# Patient Record
Sex: Female | Born: 1935
Health system: Southern US, Community
[De-identification: ages and names within clinical notes are randomized; demographics above are authoritative.]

## PROBLEM LIST (undated history)

## (undated) DIAGNOSIS — R011 Cardiac murmur, unspecified: Secondary | ICD-10-CM

## (undated) DIAGNOSIS — I10 Essential (primary) hypertension: Secondary | ICD-10-CM

## (undated) DIAGNOSIS — I25119 Atherosclerotic heart disease of native coronary artery with unspecified angina pectoris: Secondary | ICD-10-CM

## (undated) DIAGNOSIS — S8990XD Unspecified injury of unspecified lower leg, subsequent encounter: Secondary | ICD-10-CM

## (undated) HISTORY — DX: Atherosclerotic heart disease of native coronary artery with unspecified angina pectoris: I25.119

## (undated) HISTORY — PX: APPENDECTOMY: SHX54

## (undated) HISTORY — DX: Essential (primary) hypertension: I10

## (undated) HISTORY — PX: KNEE SURGERY: SHX244

## (undated) HISTORY — DX: Cardiac murmur, unspecified: R01.1

## (undated) HISTORY — DX: Unspecified injury of unspecified lower leg, subsequent encounter: S89.90XD

---

## 2013-02-20 DIAGNOSIS — Z23 Encounter for immunization: Secondary | ICD-10-CM | POA: Diagnosis not present

## 2013-03-16 DIAGNOSIS — K5900 Constipation, unspecified: Secondary | ICD-10-CM | POA: Diagnosis not present

## 2013-03-19 DIAGNOSIS — L821 Other seborrheic keratosis: Secondary | ICD-10-CM | POA: Diagnosis not present

## 2013-03-19 DIAGNOSIS — C44721 Squamous cell carcinoma of skin of unspecified lower limb, including hip: Secondary | ICD-10-CM | POA: Diagnosis not present

## 2013-03-19 DIAGNOSIS — Z85828 Personal history of other malignant neoplasm of skin: Secondary | ICD-10-CM | POA: Diagnosis not present

## 2013-03-21 DIAGNOSIS — R9431 Abnormal electrocardiogram [ECG] [EKG]: Secondary | ICD-10-CM | POA: Diagnosis not present

## 2013-03-21 DIAGNOSIS — I447 Left bundle-branch block, unspecified: Secondary | ICD-10-CM | POA: Diagnosis not present

## 2013-03-21 DIAGNOSIS — I10 Essential (primary) hypertension: Secondary | ICD-10-CM | POA: Diagnosis not present

## 2013-03-21 DIAGNOSIS — I509 Heart failure, unspecified: Secondary | ICD-10-CM | POA: Diagnosis not present

## 2013-03-21 DIAGNOSIS — I359 Nonrheumatic aortic valve disorder, unspecified: Secondary | ICD-10-CM | POA: Diagnosis not present

## 2013-03-21 DIAGNOSIS — E7800 Pure hypercholesterolemia, unspecified: Secondary | ICD-10-CM | POA: Diagnosis not present

## 2013-03-27 DIAGNOSIS — T8140XA Infection following a procedure, unspecified, initial encounter: Secondary | ICD-10-CM | POA: Diagnosis not present

## 2013-03-27 DIAGNOSIS — C4492 Squamous cell carcinoma of skin, unspecified: Secondary | ICD-10-CM | POA: Diagnosis not present

## 2013-04-23 DIAGNOSIS — S80929A Unspecified superficial injury of unspecified lower leg, initial encounter: Secondary | ICD-10-CM | POA: Diagnosis not present

## 2013-04-23 DIAGNOSIS — S70919A Unspecified superficial injury of unspecified hip, initial encounter: Secondary | ICD-10-CM | POA: Diagnosis not present

## 2013-04-23 DIAGNOSIS — S70929A Unspecified superficial injury of unspecified thigh, initial encounter: Secondary | ICD-10-CM | POA: Diagnosis not present

## 2013-04-23 DIAGNOSIS — Z23 Encounter for immunization: Secondary | ICD-10-CM | POA: Diagnosis not present

## 2013-05-02 DIAGNOSIS — IMO0002 Reserved for concepts with insufficient information to code with codable children: Secondary | ICD-10-CM | POA: Diagnosis not present

## 2013-05-02 DIAGNOSIS — C44721 Squamous cell carcinoma of skin of unspecified lower limb, including hip: Secondary | ICD-10-CM | POA: Diagnosis not present

## 2013-05-02 DIAGNOSIS — D485 Neoplasm of uncertain behavior of skin: Secondary | ICD-10-CM | POA: Diagnosis not present

## 2013-05-23 DIAGNOSIS — Z961 Presence of intraocular lens: Secondary | ICD-10-CM | POA: Diagnosis not present

## 2013-05-30 DIAGNOSIS — K5909 Other constipation: Secondary | ICD-10-CM | POA: Diagnosis not present

## 2013-06-01 DIAGNOSIS — C44721 Squamous cell carcinoma of skin of unspecified lower limb, including hip: Secondary | ICD-10-CM | POA: Diagnosis not present

## 2013-06-18 DIAGNOSIS — L905 Scar conditions and fibrosis of skin: Secondary | ICD-10-CM | POA: Diagnosis not present

## 2013-06-18 DIAGNOSIS — L570 Actinic keratosis: Secondary | ICD-10-CM | POA: Diagnosis not present

## 2013-06-18 DIAGNOSIS — Z85828 Personal history of other malignant neoplasm of skin: Secondary | ICD-10-CM | POA: Diagnosis not present

## 2013-06-18 DIAGNOSIS — D485 Neoplasm of uncertain behavior of skin: Secondary | ICD-10-CM | POA: Diagnosis not present

## 2013-06-21 DIAGNOSIS — M1710 Unilateral primary osteoarthritis, unspecified knee: Secondary | ICD-10-CM | POA: Diagnosis not present

## 2013-06-26 DIAGNOSIS — M1710 Unilateral primary osteoarthritis, unspecified knee: Secondary | ICD-10-CM | POA: Diagnosis not present

## 2013-07-11 DIAGNOSIS — Z1231 Encounter for screening mammogram for malignant neoplasm of breast: Secondary | ICD-10-CM | POA: Diagnosis not present

## 2013-07-11 DIAGNOSIS — Z9189 Other specified personal risk factors, not elsewhere classified: Secondary | ICD-10-CM | POA: Diagnosis not present

## 2013-08-04 DIAGNOSIS — IMO0001 Reserved for inherently not codable concepts without codable children: Secondary | ICD-10-CM | POA: Diagnosis not present

## 2013-08-04 DIAGNOSIS — T148 Other injury of unspecified body region: Secondary | ICD-10-CM | POA: Diagnosis not present

## 2013-08-04 DIAGNOSIS — L0291 Cutaneous abscess, unspecified: Secondary | ICD-10-CM | POA: Diagnosis not present

## 2013-08-04 DIAGNOSIS — L0390 Cellulitis, unspecified: Secondary | ICD-10-CM | POA: Diagnosis not present

## 2013-08-06 DIAGNOSIS — IMO0001 Reserved for inherently not codable concepts without codable children: Secondary | ICD-10-CM | POA: Diagnosis not present

## 2013-08-09 DIAGNOSIS — L570 Actinic keratosis: Secondary | ICD-10-CM | POA: Diagnosis not present

## 2013-08-09 DIAGNOSIS — Z85828 Personal history of other malignant neoplasm of skin: Secondary | ICD-10-CM | POA: Diagnosis not present

## 2013-08-09 DIAGNOSIS — L910 Hypertrophic scar: Secondary | ICD-10-CM | POA: Diagnosis not present

## 2013-09-07 DIAGNOSIS — I447 Left bundle-branch block, unspecified: Secondary | ICD-10-CM | POA: Diagnosis not present

## 2013-09-07 DIAGNOSIS — N318 Other neuromuscular dysfunction of bladder: Secondary | ICD-10-CM | POA: Diagnosis not present

## 2013-09-07 DIAGNOSIS — Z0000 Encounter for general adult medical examination without abnormal findings: Secondary | ICD-10-CM | POA: Diagnosis not present

## 2013-09-07 DIAGNOSIS — I10 Essential (primary) hypertension: Secondary | ICD-10-CM | POA: Diagnosis not present

## 2013-09-07 DIAGNOSIS — E785 Hyperlipidemia, unspecified: Secondary | ICD-10-CM | POA: Diagnosis not present

## 2013-09-07 DIAGNOSIS — E041 Nontoxic single thyroid nodule: Secondary | ICD-10-CM | POA: Diagnosis not present

## 2013-09-19 DIAGNOSIS — E785 Hyperlipidemia, unspecified: Secondary | ICD-10-CM | POA: Diagnosis not present

## 2013-09-19 DIAGNOSIS — I5042 Chronic combined systolic (congestive) and diastolic (congestive) heart failure: Secondary | ICD-10-CM | POA: Diagnosis not present

## 2013-09-19 DIAGNOSIS — I359 Nonrheumatic aortic valve disorder, unspecified: Secondary | ICD-10-CM | POA: Diagnosis not present

## 2013-09-19 DIAGNOSIS — I10 Essential (primary) hypertension: Secondary | ICD-10-CM | POA: Diagnosis not present

## 2013-10-05 DIAGNOSIS — S81009A Unspecified open wound, unspecified knee, initial encounter: Secondary | ICD-10-CM | POA: Diagnosis not present

## 2013-10-05 DIAGNOSIS — S81809A Unspecified open wound, unspecified lower leg, initial encounter: Secondary | ICD-10-CM | POA: Diagnosis not present

## 2013-10-16 DIAGNOSIS — Z111 Encounter for screening for respiratory tuberculosis: Secondary | ICD-10-CM | POA: Diagnosis not present

## 2013-10-17 DIAGNOSIS — S91009A Unspecified open wound, unspecified ankle, initial encounter: Secondary | ICD-10-CM | POA: Diagnosis not present

## 2013-10-17 DIAGNOSIS — S81009A Unspecified open wound, unspecified knee, initial encounter: Secondary | ICD-10-CM | POA: Diagnosis not present

## 2013-11-09 DIAGNOSIS — Z23 Encounter for immunization: Secondary | ICD-10-CM | POA: Diagnosis not present

## 2013-11-09 DIAGNOSIS — L578 Other skin changes due to chronic exposure to nonionizing radiation: Secondary | ICD-10-CM | POA: Diagnosis not present

## 2013-11-09 DIAGNOSIS — Z85828 Personal history of other malignant neoplasm of skin: Secondary | ICD-10-CM | POA: Diagnosis not present

## 2013-11-09 DIAGNOSIS — L821 Other seborrheic keratosis: Secondary | ICD-10-CM | POA: Diagnosis not present

## 2013-11-09 DIAGNOSIS — L570 Actinic keratosis: Secondary | ICD-10-CM | POA: Diagnosis not present

## 2013-11-20 DIAGNOSIS — Z23 Encounter for immunization: Secondary | ICD-10-CM | POA: Diagnosis not present

## 2013-11-20 DIAGNOSIS — I10 Essential (primary) hypertension: Secondary | ICD-10-CM | POA: Diagnosis not present

## 2013-11-20 DIAGNOSIS — K5900 Constipation, unspecified: Secondary | ICD-10-CM | POA: Diagnosis not present

## 2013-12-25 DIAGNOSIS — S81802A Unspecified open wound, left lower leg, initial encounter: Secondary | ICD-10-CM | POA: Diagnosis not present

## 2013-12-25 DIAGNOSIS — T148 Other injury of unspecified body region: Secondary | ICD-10-CM | POA: Diagnosis not present

## 2014-01-07 ENCOUNTER — Encounter (HOSPITAL_COMMUNITY): Payer: Self-pay | Admitting: Emergency Medicine

## 2014-01-07 ENCOUNTER — Emergency Department (INDEPENDENT_AMBULATORY_CARE_PROVIDER_SITE_OTHER)
Admission: EM | Admit: 2014-01-07 | Discharge: 2014-01-07 | Disposition: A | Discharge status: Home / Self Care | Payer: Medicare Other | Source: Home / Self Care | Attending: Family Medicine | Admitting: Family Medicine

## 2014-01-07 DIAGNOSIS — S80812A Abrasion, left lower leg, initial encounter: Secondary | ICD-10-CM

## 2014-01-07 MED ORDER — SILVER SULFADIAZINE 1 % EX CREA: 1.0000 "application " | TOPICAL_CREAM | Freq: Three times a day (TID) | CUTANEOUS | Status: DC

## 2014-01-07 NOTE — ED Provider Notes (Signed)
CSN: [id number]     Arrival date & time 01/07/14  1444 History   First MD Initiated Contact with Patient 01/07/14 1529     Chief Complaint  Patient presents with  . Sore   (Consider location/radiation/quality/duration/timing/severity/associated sxs/prior Treatment) Patient is a 80 y.o. female presenting with wound check. The history is provided by the patient.  Wound Check This is a new problem. The current episode started more than 1 week ago (scrape to left lower leg, nonhealing.). The problem has been gradually worsening.    History reviewed. No pertinent past medical history. Past Surgical History  Procedure Laterality Date  . Appendectomy    . Knee surgery Right    No family history on file. History  Substance Use Topics  . Smoking status: Never Smoker   . Smokeless tobacco: Not on file  . Alcohol Use: No   OB History    No data available     Review of Systems  Constitutional: Negative.   Skin: Positive for wound.    Allergies  Review of patient's allergies indicates no known allergies.  Home Medications   Prior to Admission medications   Medication Sig Start Date End Date Taking? Authorizing Provider  silver sulfADIAZINE (SILVADENE) 1 % cream Apply 1 application topically 3 (three) times daily. After washing. 01/07/14   Billy Fischer, MD   BP 154/85 mmHg  Pulse 96  Temp(Src) 97.9 F (36.6 C) (Oral)  Resp 16  SpO2 98% Physical Exam  Constitutional: She is oriented to person, place, and time. She appears well-developed and well-nourished.  Musculoskeletal: She exhibits tenderness.  Neurological: She is alert and oriented to person, place, and time.  Skin: Skin is warm and dry. Rash noted.  Sl erythematous 1.5cm circ abrasion to left lower leg. No infection  Nursing note and vitals reviewed.   ED Course  Procedures (including critical care time) Labs Review Labs Reviewed - No data to display  Imaging Review No results found.   MDM   1.  Abrasion of left lower leg, initial encounter     Wound care given,dsd applied.  Billy Fischer, MD 01/07/14 [phone]

## 2014-01-07 NOTE — ED Notes (Signed)
Open wound on lower left leg.  Patient has a history of the same.

## 2014-01-09 DIAGNOSIS — S81802A Unspecified open wound, left lower leg, initial encounter: Secondary | ICD-10-CM | POA: Diagnosis not present

## 2014-01-09 DIAGNOSIS — L03116 Cellulitis of left lower limb: Secondary | ICD-10-CM | POA: Diagnosis not present

## 2014-01-24 DIAGNOSIS — K5909 Other constipation: Secondary | ICD-10-CM | POA: Diagnosis not present

## 2014-03-15 DIAGNOSIS — L570 Actinic keratosis: Secondary | ICD-10-CM | POA: Diagnosis not present

## 2014-03-15 DIAGNOSIS — L814 Other melanin hyperpigmentation: Secondary | ICD-10-CM | POA: Diagnosis not present

## 2014-03-15 DIAGNOSIS — L578 Other skin changes due to chronic exposure to nonionizing radiation: Secondary | ICD-10-CM | POA: Diagnosis not present

## 2014-05-13 DIAGNOSIS — L821 Other seborrheic keratosis: Secondary | ICD-10-CM | POA: Diagnosis not present

## 2014-05-13 DIAGNOSIS — D225 Melanocytic nevi of trunk: Secondary | ICD-10-CM | POA: Diagnosis not present

## 2014-07-24 DIAGNOSIS — M1711 Unilateral primary osteoarthritis, right knee: Secondary | ICD-10-CM | POA: Diagnosis not present

## 2014-07-30 DIAGNOSIS — K5900 Constipation, unspecified: Secondary | ICD-10-CM | POA: Diagnosis not present

## 2014-07-30 DIAGNOSIS — Z1389 Encounter for screening for other disorder: Secondary | ICD-10-CM | POA: Diagnosis not present

## 2014-07-30 DIAGNOSIS — Z6825 Body mass index (BMI) 25.0-25.9, adult: Secondary | ICD-10-CM | POA: Diagnosis not present

## 2014-07-30 DIAGNOSIS — C4490 Unspecified malignant neoplasm of skin, unspecified: Secondary | ICD-10-CM | POA: Diagnosis not present

## 2014-08-14 DIAGNOSIS — Z85828 Personal history of other malignant neoplasm of skin: Secondary | ICD-10-CM | POA: Diagnosis not present

## 2014-08-14 DIAGNOSIS — D1801 Hemangioma of skin and subcutaneous tissue: Secondary | ICD-10-CM | POA: Diagnosis not present

## 2014-08-14 DIAGNOSIS — D225 Melanocytic nevi of trunk: Secondary | ICD-10-CM | POA: Diagnosis not present

## 2014-08-14 DIAGNOSIS — L821 Other seborrheic keratosis: Secondary | ICD-10-CM | POA: Diagnosis not present

## 2014-08-14 DIAGNOSIS — L570 Actinic keratosis: Secondary | ICD-10-CM | POA: Diagnosis not present

## 2014-08-14 DIAGNOSIS — L720 Epidermal cyst: Secondary | ICD-10-CM | POA: Diagnosis not present

## 2014-09-09 DIAGNOSIS — B029 Zoster without complications: Secondary | ICD-10-CM | POA: Diagnosis not present

## 2014-09-10 DIAGNOSIS — B029 Zoster without complications: Secondary | ICD-10-CM | POA: Diagnosis not present

## 2014-09-10 DIAGNOSIS — Z6825 Body mass index (BMI) 25.0-25.9, adult: Secondary | ICD-10-CM | POA: Diagnosis not present

## 2014-09-10 DIAGNOSIS — I447 Left bundle-branch block, unspecified: Secondary | ICD-10-CM | POA: Diagnosis not present

## 2014-09-24 DIAGNOSIS — R8290 Unspecified abnormal findings in urine: Secondary | ICD-10-CM | POA: Diagnosis not present

## 2014-09-24 DIAGNOSIS — C4490 Unspecified malignant neoplasm of skin, unspecified: Secondary | ICD-10-CM | POA: Diagnosis not present

## 2014-09-24 DIAGNOSIS — Z0000 Encounter for general adult medical examination without abnormal findings: Secondary | ICD-10-CM | POA: Diagnosis not present

## 2014-09-24 DIAGNOSIS — N390 Urinary tract infection, site not specified: Secondary | ICD-10-CM | POA: Diagnosis not present

## 2014-10-01 DIAGNOSIS — Z0000 Encounter for general adult medical examination without abnormal findings: Secondary | ICD-10-CM | POA: Diagnosis not present

## 2014-10-01 DIAGNOSIS — Z6824 Body mass index (BMI) 24.0-24.9, adult: Secondary | ICD-10-CM | POA: Diagnosis not present

## 2014-10-01 DIAGNOSIS — M810 Age-related osteoporosis without current pathological fracture: Secondary | ICD-10-CM | POA: Diagnosis not present

## 2014-10-01 DIAGNOSIS — R21 Rash and other nonspecific skin eruption: Secondary | ICD-10-CM | POA: Diagnosis not present

## 2014-10-25 DIAGNOSIS — B0229 Other postherpetic nervous system involvement: Secondary | ICD-10-CM | POA: Diagnosis not present

## 2014-10-25 DIAGNOSIS — Z6825 Body mass index (BMI) 25.0-25.9, adult: Secondary | ICD-10-CM | POA: Diagnosis not present

## 2014-11-04 DIAGNOSIS — L821 Other seborrheic keratosis: Secondary | ICD-10-CM | POA: Diagnosis not present

## 2014-11-04 DIAGNOSIS — L718 Other rosacea: Secondary | ICD-10-CM | POA: Diagnosis not present

## 2014-11-07 ENCOUNTER — Ambulatory Visit (INDEPENDENT_AMBULATORY_CARE_PROVIDER_SITE_OTHER): Payer: Medicare Other | Admitting: Podiatry

## 2014-11-07 ENCOUNTER — Encounter: Payer: Self-pay | Admitting: Podiatry

## 2014-11-07 VITALS — BP 164/101 | HR 89 | Resp 16

## 2014-11-07 DIAGNOSIS — L603 Nail dystrophy: Secondary | ICD-10-CM

## 2014-11-07 DIAGNOSIS — M2010 Hallux valgus (acquired), unspecified foot: Secondary | ICD-10-CM

## 2014-11-07 DIAGNOSIS — M2040 Other hammer toe(s) (acquired), unspecified foot: Secondary | ICD-10-CM

## 2014-11-07 DIAGNOSIS — S90221A Contusion of right lesser toe(s) with damage to nail, initial encounter: Secondary | ICD-10-CM

## 2014-11-07 NOTE — Progress Notes (Signed)
   Subjective:    Patient ID: Cristina Hoffman, female    DOB: 05-May-1923, 81 y.o.   MRN: [id number]  HPI Patient presents with a nail problem on their right foot, 2nd toe. Nail discoloration and nail thickness. This has been going on for the past  2 months. Pt stated, "she has had shingles for the past 2 months". Pt can't remember what medicine she is taking for her shingles.   Review of Systems  Musculoskeletal: Positive for arthralgias.  All other systems reviewed and are negative.      Objective:   Physical Exam        Assessment & Plan:

## 2014-11-07 NOTE — Progress Notes (Signed)
Subjective:     Patient ID: Cristina Hoffman, female   DOB: 08-01-1923, 81 y.o.   MRN: [id number]  HPI patient presents with elevated second toe of the right foot with thick yellow brittle nailbed that has discoloration and irritation of the top of the toe.   Review of Systems  All other systems reviewed and are negative.      Objective:   Physical Exam  Constitutional: She is oriented to person, place, and time.  Cardiovascular: Intact distal pulses.   Musculoskeletal: Normal range of motion.  Neurological: She is oriented to person, place, and time.  Skin: Skin is warm.  Nursing note and vitals reviewed.  neurovascular status is intact muscle strength adequate range of motion within normal limits with patient noted to have a elevated second digit right that's rigid structural bunion deformity and distal thickness of the nailbed itself with black type discoloration of a localized nature that is new in its orientation     Assessment:     Hammertoe deformity with structural bunion and damaged second nail right which may be due to deformity    Plan:     H&P and condition reviewed with patient. Today I smoothed the nail down and advised on padding and if symptoms were to get worse we will reevaluate her and decide what else may be appropriate

## 2014-11-18 DIAGNOSIS — B0229 Other postherpetic nervous system involvement: Secondary | ICD-10-CM | POA: Diagnosis not present

## 2014-11-18 DIAGNOSIS — Z6825 Body mass index (BMI) 25.0-25.9, adult: Secondary | ICD-10-CM | POA: Diagnosis not present

## 2014-11-18 DIAGNOSIS — H1089 Other conjunctivitis: Secondary | ICD-10-CM | POA: Diagnosis not present

## 2014-12-23 ENCOUNTER — Ambulatory Visit: Payer: PRIVATE HEALTH INSURANCE | Admitting: Podiatry

## 2014-12-27 DIAGNOSIS — M1811 Unilateral primary osteoarthritis of first carpometacarpal joint, right hand: Secondary | ICD-10-CM | POA: Diagnosis not present

## 2014-12-27 DIAGNOSIS — M19041 Primary osteoarthritis, right hand: Secondary | ICD-10-CM | POA: Diagnosis not present

## 2014-12-30 DIAGNOSIS — L578 Other skin changes due to chronic exposure to nonionizing radiation: Secondary | ICD-10-CM | POA: Diagnosis not present

## 2014-12-30 DIAGNOSIS — L718 Other rosacea: Secondary | ICD-10-CM | POA: Diagnosis not present

## 2014-12-30 DIAGNOSIS — L821 Other seborrheic keratosis: Secondary | ICD-10-CM | POA: Diagnosis not present

## 2014-12-30 DIAGNOSIS — L910 Hypertrophic scar: Secondary | ICD-10-CM | POA: Diagnosis not present

## 2015-03-20 DIAGNOSIS — Z23 Encounter for immunization: Secondary | ICD-10-CM | POA: Diagnosis not present

## 2015-03-25 DIAGNOSIS — L821 Other seborrheic keratosis: Secondary | ICD-10-CM | POA: Diagnosis not present

## 2015-04-28 DIAGNOSIS — R011 Cardiac murmur, unspecified: Secondary | ICD-10-CM | POA: Diagnosis not present

## 2015-04-28 DIAGNOSIS — I2510 Atherosclerotic heart disease of native coronary artery without angina pectoris: Secondary | ICD-10-CM | POA: Diagnosis not present

## 2015-04-28 DIAGNOSIS — S8990XA Unspecified injury of unspecified lower leg, initial encounter: Secondary | ICD-10-CM | POA: Diagnosis not present

## 2015-04-28 DIAGNOSIS — I10 Essential (primary) hypertension: Secondary | ICD-10-CM | POA: Diagnosis not present

## 2015-04-28 DIAGNOSIS — I25110 Atherosclerotic heart disease of native coronary artery with unstable angina pectoris: Secondary | ICD-10-CM | POA: Diagnosis not present

## 2015-05-05 DIAGNOSIS — D692 Other nonthrombocytopenic purpura: Secondary | ICD-10-CM | POA: Diagnosis not present

## 2015-05-05 DIAGNOSIS — C44319 Basal cell carcinoma of skin of other parts of face: Secondary | ICD-10-CM | POA: Diagnosis not present

## 2015-05-05 DIAGNOSIS — L570 Actinic keratosis: Secondary | ICD-10-CM | POA: Diagnosis not present

## 2015-05-05 DIAGNOSIS — L821 Other seborrheic keratosis: Secondary | ICD-10-CM | POA: Diagnosis not present

## 2015-05-05 DIAGNOSIS — L814 Other melanin hyperpigmentation: Secondary | ICD-10-CM | POA: Diagnosis not present

## 2015-05-14 DIAGNOSIS — L859 Epidermal thickening, unspecified: Secondary | ICD-10-CM | POA: Diagnosis not present

## 2015-05-14 DIAGNOSIS — D485 Neoplasm of uncertain behavior of skin: Secondary | ICD-10-CM | POA: Diagnosis not present

## 2015-05-15 ENCOUNTER — Ambulatory Visit: Payer: Medicare Other | Admitting: Cardiovascular Disease

## 2015-06-02 DIAGNOSIS — Z85828 Personal history of other malignant neoplasm of skin: Secondary | ICD-10-CM | POA: Diagnosis not present

## 2015-06-02 DIAGNOSIS — C44319 Basal cell carcinoma of skin of other parts of face: Secondary | ICD-10-CM | POA: Diagnosis not present

## 2015-06-03 DIAGNOSIS — R011 Cardiac murmur, unspecified: Secondary | ICD-10-CM | POA: Diagnosis not present

## 2015-06-03 DIAGNOSIS — I10 Essential (primary) hypertension: Secondary | ICD-10-CM | POA: Diagnosis not present

## 2015-06-03 DIAGNOSIS — I2510 Atherosclerotic heart disease of native coronary artery without angina pectoris: Secondary | ICD-10-CM | POA: Diagnosis not present

## 2015-06-04 ENCOUNTER — Ambulatory Visit: Payer: Medicare Other | Admitting: Cardiovascular Disease

## 2015-06-13 DIAGNOSIS — I10 Essential (primary) hypertension: Secondary | ICD-10-CM | POA: Diagnosis not present

## 2015-06-16 DIAGNOSIS — I25119 Atherosclerotic heart disease of native coronary artery with unspecified angina pectoris: Secondary | ICD-10-CM | POA: Insufficient documentation

## 2015-06-16 DIAGNOSIS — I10 Essential (primary) hypertension: Secondary | ICD-10-CM | POA: Insufficient documentation

## 2015-06-16 DIAGNOSIS — R011 Cardiac murmur, unspecified: Secondary | ICD-10-CM | POA: Insufficient documentation

## 2015-06-17 DIAGNOSIS — Z85828 Personal history of other malignant neoplasm of skin: Secondary | ICD-10-CM | POA: Diagnosis not present

## 2015-06-17 DIAGNOSIS — L821 Other seborrheic keratosis: Secondary | ICD-10-CM | POA: Diagnosis not present

## 2015-06-18 ENCOUNTER — Ambulatory Visit (INDEPENDENT_AMBULATORY_CARE_PROVIDER_SITE_OTHER): Payer: Medicare Other | Admitting: Cardiovascular Disease

## 2015-06-18 ENCOUNTER — Encounter: Payer: Self-pay | Admitting: Cardiovascular Disease

## 2015-06-18 VITALS — BP 150/96 | HR 82 | Ht 62.0 in | Wt 145.8 lb

## 2015-06-18 DIAGNOSIS — I10 Essential (primary) hypertension: Secondary | ICD-10-CM

## 2015-06-18 DIAGNOSIS — R011 Cardiac murmur, unspecified: Secondary | ICD-10-CM | POA: Diagnosis not present

## 2015-06-18 DIAGNOSIS — I447 Left bundle-branch block, unspecified: Secondary | ICD-10-CM

## 2015-06-18 NOTE — Patient Instructions (Signed)
Medication Instructions:  Your physician recommends that you continue on your current medications as directed. Please refer to the Current Medication list given to you today.   Labwork: None Ordered   Testing/Procedures: None Ordered   Follow-Up: Your physician recommends that you schedule a follow-up appointment in: as needed with Dr. Nahser   If you need a refill on your cardiac medications before your next appointment, please call your pharmacy.   Thank you for choosing [facility]! Angello Chien, RN [phone]    

## 2015-06-18 NOTE — Progress Notes (Signed)
Cardiology Office Note   Date:  06/18/2015   ID:  Cristina Hoffman, DOB [birth year], MRN [id number]  PCP:  No primary care provider on file.  Cardiologist:   Nahser, Wonda Cheng, MD   Chief Complaint  Patient presents with  . Coronary Artery Disease   Problem List 1. NS IVCD 2. HTN   History of Present Illness: Cristina Hoffman is a 82 y.o. female who presents for evaluation of LBBB.  Was seen along.   Minimal records are avaliable. I have revied records from Dr. Ernie Hew.    She mentions CAD as a diangosis . She informed me that she has had a bundle branch block and has been followed by cardiology for years when she lived in [place], [place]    Has NS IVCD on ECG today  No hx of CP No hx of cath ,  Hx of HTN but  Does not take her Losartan because it causes her to "not be able to function " No PND or orthopnea. No leg swelling  Tries to walk at [place].     From [place],  Now lives on [employer] retirement, Independent living / assisted living   She avoids salt .     Past Medical History  Diagnosis Date  . Atherosclerotic heart disease of native coronary artery with angina pectoris ([place])   . Essential (primary) hypertension   . Unspecified injury of unspecified lower leg, subsequent encounter   . Murmur, heart     Past Surgical History  Procedure Laterality Date  . Appendectomy    . Knee surgery Right      Current Outpatient Prescriptions  Medication Sig Dispense Refill  . Cholecalciferol (VITAMIN D3) 2000 units TABS Take 2,000 Units by mouth daily.    Marland Kitchen losartan (COZAAR) 50 MG tablet Take 50 mg by mouth daily.    . Multiple Vitamin (MULTI VITAMIN PO) Take one tab by mouth daily    . nitroGLYCERIN (NITROSTAT) 0.4 MG SL tablet Place 0.4 mg under the tongue every 5 (five) minutes as needed for chest pain.    Marland Kitchen pyridOXINE (VITAMIN B-6) 100 MG tablet Take 100 mg by mouth daily.    . vitamin B-12 (CYANOCOBALAMIN) 1000 MCG tablet Take 1,000 mcg by mouth daily.     No  current facility-administered medications for this visit.    Allergies:   Review of patient's allergies indicates no known allergies.    Social History:  The patient  reports that she has never smoked. She does not have any smokeless tobacco history on file. She reports that she does not drink alcohol or use illicit drugs.   Family History:  The patient's family history is not on file.    ROS:  Please see the history of present illness.    Review of Systems: Constitutional:  denies fever, chills, diaphoresis, appetite change and fatigue.  HEENT: denies photophobia, eye pain, redness, hearing loss, ear pain, congestion, sore throat, rhinorrhea, sneezing, neck pain, neck stiffness and tinnitus.  Respiratory: denies SOB, DOE, cough, chest tightness, and wheezing.  Cardiovascular: denies chest pain, palpitations and leg swelling.  Gastrointestinal: denies nausea, vomiting, abdominal pain, diarrhea, constipation, blood in stool.  Genitourinary: denies dysuria, urgency, frequency, hematuria, flank pain and difficulty urinating.  Musculoskeletal: denies  myalgias, back pain, joint swelling, arthralgias and gait problem.   Skin: denies pallor, rash and wound.  Neurological: denies dizziness, seizures, syncope, weakness, light-headedness, numbness and headaches.   Hematological: denies adenopathy, easy bruising, personal or family bleeding  history.  Psychiatric/ Behavioral: denies suicidal ideation, mood changes, confusion, nervousness, sleep disturbance and agitation.       All other systems are reviewed and negative.    PHYSICAL EXAM: VS:  BP 150/96 mmHg  Pulse 82  Ht 5\' 2"  (1.575 m)  Wt 145 lb 12.8 oz (66.134 kg)  BMI 26.66 kg/m2 , BMI Body mass index is 26.66 kg/(m^2). GEN: Well nourished, well developed, in no acute distress, elderly female,  HEENT: normal Neck: no JVD, carotid bruits, or masses Cardiac: RRR; very soft systolic  murmur, rubs, or gallops,no edema , pulses are  excellent  Respiratory:  clear to auscultation bilaterally, normal work of breathing GI: soft, nontender, nondistended, + BS MS: no deformity or atrophy Skin: warm and dry, no rash Neuro:  Strength and sensation are intact Psych: normal   EKG:  EKG is ordered today. The ekg ordered today demonstrates  NSR at 82.   Occasional PACs   NS IVCD ( incomplete LBBB )    Recent Labs: No results found for requested labs within last 365 days.    Lipid Panel No results found for: CHOL, TRIG, HDL, CHOLHDL, VLDL, LDLCALC, LDLDIRECT    Wt Readings from Last 3 Encounters:  06/18/15 145 lb 12.8 oz (66.134 kg)      Other studies Reviewed: Additional studies/ records that were reviewed today include: . Review of the above records demonstrates:    ASSESSMENT AND PLAN:  1.  Nonspecific intraventricular conduction delay / incomplete LBBB She has no cardiac complaints - no signs or symptoms of CHF.  No angina.   Has never had angina or CHF.   Very active  She is very healthy for 82 yo. At this point I do not think that she needs any additional workup.  2. Essential hypertension: Her diastolic blood pressure is a little bit low. She's been tried on losartan but she states that his isthmus or feel poorly and she refuses to take anything for her blood pressure.  We will have her follow-up with her primary medical doctor and will see her as neede.d     Current medicines are reviewed at length with the patient today.  The patient does not have concerns regarding medicines.  The following changes have been made:  no change  Labs/ tests ordered today include:  No orders of the defined types were placed in this encounter.     Disposition:   FU with me as needed.       Nahser, Wonda Cheng, MD  06/18/2015 10:27 AM    [facility] [place], [place], [place]  [address] Phone: [phone]; Fax: [phone]   [facility]  [address] [place],   [address] [phone]   Fax [phone]

## 2015-06-25 ENCOUNTER — Encounter: Payer: Self-pay | Admitting: Cardiovascular Disease

## 2015-08-13 DIAGNOSIS — Z23 Encounter for immunization: Secondary | ICD-10-CM | POA: Diagnosis not present

## 2015-08-13 DIAGNOSIS — R011 Cardiac murmur, unspecified: Secondary | ICD-10-CM | POA: Diagnosis not present

## 2015-08-13 DIAGNOSIS — I10 Essential (primary) hypertension: Secondary | ICD-10-CM | POA: Diagnosis not present

## 2015-08-13 DIAGNOSIS — Z0000 Encounter for general adult medical examination without abnormal findings: Secondary | ICD-10-CM | POA: Diagnosis not present

## 2015-08-13 DIAGNOSIS — I2510 Atherosclerotic heart disease of native coronary artery without angina pectoris: Secondary | ICD-10-CM | POA: Diagnosis not present

## 2015-08-27 DIAGNOSIS — H00025 Hordeolum internum left lower eyelid: Secondary | ICD-10-CM | POA: Diagnosis not present

## 2015-09-03 DIAGNOSIS — H0015 Chalazion left lower eyelid: Secondary | ICD-10-CM | POA: Diagnosis not present

## 2015-09-09 DIAGNOSIS — M549 Dorsalgia, unspecified: Secondary | ICD-10-CM | POA: Diagnosis not present

## 2015-09-10 ENCOUNTER — Other Ambulatory Visit: Payer: Self-pay | Admitting: Family Medicine

## 2015-09-10 ENCOUNTER — Ambulatory Visit
Admission: RE | Admit: 2015-09-10 | Discharge: 2015-09-10 | Disposition: A | Discharge status: Home / Self Care | Payer: Medicare Other | Source: Ambulatory Visit | Attending: Family Medicine | Admitting: Family Medicine

## 2015-09-10 DIAGNOSIS — M160 Bilateral primary osteoarthritis of hip: Secondary | ICD-10-CM | POA: Diagnosis not present

## 2015-09-10 DIAGNOSIS — M47814 Spondylosis without myelopathy or radiculopathy, thoracic region: Secondary | ICD-10-CM | POA: Diagnosis not present

## 2015-09-10 DIAGNOSIS — R52 Pain, unspecified: Secondary | ICD-10-CM

## 2015-09-10 DIAGNOSIS — M47816 Spondylosis without myelopathy or radiculopathy, lumbar region: Secondary | ICD-10-CM | POA: Diagnosis not present

## 2015-09-11 ENCOUNTER — Other Ambulatory Visit: Payer: Self-pay | Admitting: Family Medicine

## 2015-09-11 ENCOUNTER — Ambulatory Visit
Admission: RE | Admit: 2015-09-11 | Discharge: 2015-09-11 | Disposition: A | Discharge status: Home / Self Care | Payer: Medicare Other | Source: Ambulatory Visit | Attending: Family Medicine | Admitting: Family Medicine

## 2015-09-11 DIAGNOSIS — R9389 Abnormal findings on diagnostic imaging of other specified body structures: Secondary | ICD-10-CM

## 2015-09-11 DIAGNOSIS — I517 Cardiomegaly: Secondary | ICD-10-CM | POA: Diagnosis not present

## 2015-09-17 DIAGNOSIS — H0015 Chalazion left lower eyelid: Secondary | ICD-10-CM | POA: Diagnosis not present

## 2015-09-23 DIAGNOSIS — H00015 Hordeolum externum left lower eyelid: Secondary | ICD-10-CM | POA: Diagnosis not present

## 2015-09-23 DIAGNOSIS — M549 Dorsalgia, unspecified: Secondary | ICD-10-CM | POA: Diagnosis not present

## 2015-09-23 DIAGNOSIS — I10 Essential (primary) hypertension: Secondary | ICD-10-CM | POA: Diagnosis not present

## 2015-10-08 DIAGNOSIS — H0015 Chalazion left lower eyelid: Secondary | ICD-10-CM | POA: Diagnosis not present

## 2015-10-22 DIAGNOSIS — D485 Neoplasm of uncertain behavior of skin: Secondary | ICD-10-CM | POA: Diagnosis not present

## 2015-10-22 DIAGNOSIS — L570 Actinic keratosis: Secondary | ICD-10-CM | POA: Diagnosis not present

## 2015-11-05 DIAGNOSIS — H0015 Chalazion left lower eyelid: Secondary | ICD-10-CM | POA: Diagnosis not present

## 2015-11-18 DIAGNOSIS — L910 Hypertrophic scar: Secondary | ICD-10-CM | POA: Diagnosis not present

## 2015-11-18 DIAGNOSIS — D485 Neoplasm of uncertain behavior of skin: Secondary | ICD-10-CM | POA: Diagnosis not present

## 2015-11-18 DIAGNOSIS — C44319 Basal cell carcinoma of skin of other parts of face: Secondary | ICD-10-CM | POA: Diagnosis not present

## 2015-11-18 DIAGNOSIS — L570 Actinic keratosis: Secondary | ICD-10-CM | POA: Diagnosis not present

## 2015-11-18 DIAGNOSIS — L820 Inflamed seborrheic keratosis: Secondary | ICD-10-CM | POA: Diagnosis not present

## 2015-11-18 DIAGNOSIS — Z85828 Personal history of other malignant neoplasm of skin: Secondary | ICD-10-CM | POA: Diagnosis not present

## 2015-11-20 DIAGNOSIS — Z23 Encounter for immunization: Secondary | ICD-10-CM | POA: Diagnosis not present

## 2015-12-30 DIAGNOSIS — C44319 Basal cell carcinoma of skin of other parts of face: Secondary | ICD-10-CM | POA: Diagnosis not present

## 2015-12-31 DIAGNOSIS — C44319 Basal cell carcinoma of skin of other parts of face: Secondary | ICD-10-CM | POA: Diagnosis not present

## 2016-01-02 DIAGNOSIS — S81812A Laceration without foreign body, left lower leg, initial encounter: Secondary | ICD-10-CM | POA: Diagnosis not present

## 2016-01-02 DIAGNOSIS — Z6828 Body mass index (BMI) 28.0-28.9, adult: Secondary | ICD-10-CM | POA: Diagnosis not present

## 2016-02-02 DIAGNOSIS — R011 Cardiac murmur, unspecified: Secondary | ICD-10-CM | POA: Diagnosis not present

## 2016-02-02 DIAGNOSIS — E559 Vitamin D deficiency, unspecified: Secondary | ICD-10-CM | POA: Diagnosis not present

## 2016-02-02 DIAGNOSIS — I10 Essential (primary) hypertension: Secondary | ICD-10-CM | POA: Diagnosis not present

## 2016-02-04 DIAGNOSIS — I10 Essential (primary) hypertension: Secondary | ICD-10-CM | POA: Diagnosis not present

## 2016-02-04 DIAGNOSIS — R011 Cardiac murmur, unspecified: Secondary | ICD-10-CM | POA: Diagnosis not present

## 2016-02-04 DIAGNOSIS — Z6828 Body mass index (BMI) 28.0-28.9, adult: Secondary | ICD-10-CM | POA: Diagnosis not present

## 2016-02-04 DIAGNOSIS — R7989 Other specified abnormal findings of blood chemistry: Secondary | ICD-10-CM | POA: Diagnosis not present

## 2016-02-12 DIAGNOSIS — S61207A Unspecified open wound of left little finger without damage to nail, initial encounter: Secondary | ICD-10-CM | POA: Diagnosis not present

## 2016-02-12 DIAGNOSIS — Z6828 Body mass index (BMI) 28.0-28.9, adult: Secondary | ICD-10-CM | POA: Diagnosis not present

## 2016-02-12 DIAGNOSIS — R413 Other amnesia: Secondary | ICD-10-CM | POA: Diagnosis not present

## 2016-02-12 DIAGNOSIS — I10 Essential (primary) hypertension: Secondary | ICD-10-CM | POA: Diagnosis not present

## 2016-02-12 DIAGNOSIS — Z23 Encounter for immunization: Secondary | ICD-10-CM | POA: Diagnosis not present

## 2016-02-13 DIAGNOSIS — D1801 Hemangioma of skin and subcutaneous tissue: Secondary | ICD-10-CM | POA: Diagnosis not present

## 2016-02-13 DIAGNOSIS — D692 Other nonthrombocytopenic purpura: Secondary | ICD-10-CM | POA: Diagnosis not present

## 2016-02-13 DIAGNOSIS — Z85828 Personal history of other malignant neoplasm of skin: Secondary | ICD-10-CM | POA: Diagnosis not present

## 2016-03-08 DIAGNOSIS — E559 Vitamin D deficiency, unspecified: Secondary | ICD-10-CM | POA: Diagnosis not present

## 2016-03-08 DIAGNOSIS — R7989 Other specified abnormal findings of blood chemistry: Secondary | ICD-10-CM | POA: Diagnosis not present

## 2016-03-08 DIAGNOSIS — Z79899 Other long term (current) drug therapy: Secondary | ICD-10-CM | POA: Diagnosis not present

## 2016-03-08 DIAGNOSIS — I10 Essential (primary) hypertension: Secondary | ICD-10-CM | POA: Diagnosis not present

## 2016-05-28 DIAGNOSIS — S51801A Unspecified open wound of right forearm, initial encounter: Secondary | ICD-10-CM | POA: Diagnosis not present

## 2016-05-28 DIAGNOSIS — R030 Elevated blood-pressure reading, without diagnosis of hypertension: Secondary | ICD-10-CM | POA: Diagnosis not present

## 2016-06-10 DIAGNOSIS — R413 Other amnesia: Secondary | ICD-10-CM | POA: Diagnosis not present

## 2016-06-10 DIAGNOSIS — I10 Essential (primary) hypertension: Secondary | ICD-10-CM | POA: Diagnosis not present

## 2016-06-10 DIAGNOSIS — R011 Cardiac murmur, unspecified: Secondary | ICD-10-CM | POA: Diagnosis not present

## 2016-07-28 DIAGNOSIS — I129 Hypertensive chronic kidney disease with stage 1 through stage 4 chronic kidney disease, or unspecified chronic kidney disease: Secondary | ICD-10-CM | POA: Diagnosis not present

## 2016-07-28 DIAGNOSIS — N183 Chronic kidney disease, stage 3 (moderate): Secondary | ICD-10-CM | POA: Diagnosis not present

## 2016-07-28 DIAGNOSIS — R011 Cardiac murmur, unspecified: Secondary | ICD-10-CM | POA: Diagnosis not present

## 2016-07-28 DIAGNOSIS — R413 Other amnesia: Secondary | ICD-10-CM | POA: Diagnosis not present

## 2016-07-30 ENCOUNTER — Other Ambulatory Visit: Payer: Self-pay | Admitting: Geriatric Medicine

## 2016-07-30 DIAGNOSIS — R011 Cardiac murmur, unspecified: Secondary | ICD-10-CM

## 2016-08-10 ENCOUNTER — Other Ambulatory Visit (HOSPITAL_COMMUNITY): Payer: Medicare Other

## 2016-09-16 ENCOUNTER — Encounter: Payer: Self-pay | Admitting: *Deleted

## 2017-02-24 DIAGNOSIS — L821 Other seborrheic keratosis: Secondary | ICD-10-CM | POA: Diagnosis not present

## 2018-05-30 IMAGING — CR DG LUMBAR SPINE COMPLETE 4+V
5 series · 5 of 5 positions shown · non-contrast
Comparison: No prior.

CLINICAL DATA: Pain.  Radiation to left hip.

EXAM:
LUMBAR SPINE - COMPLETE 4+ VIEW

[t l-spine a.p.]
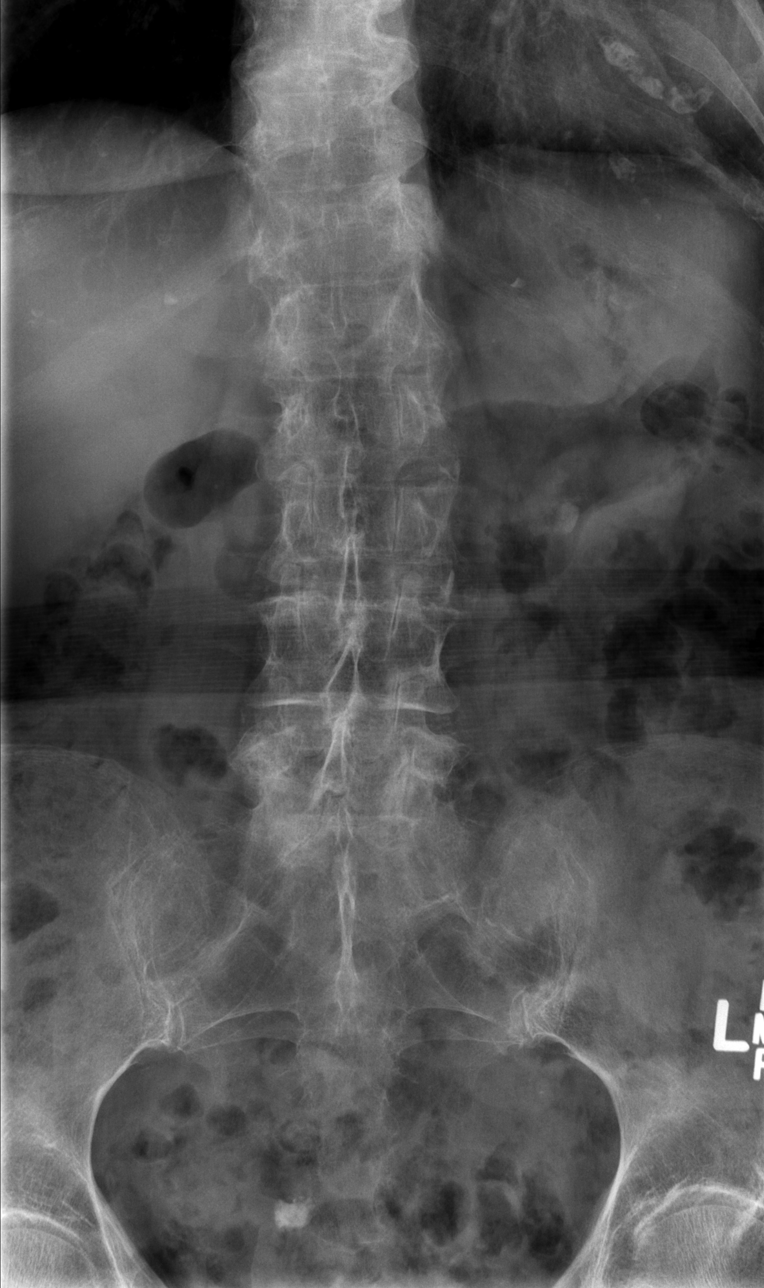

[t l-spine oblique exposure (1 of 2)]
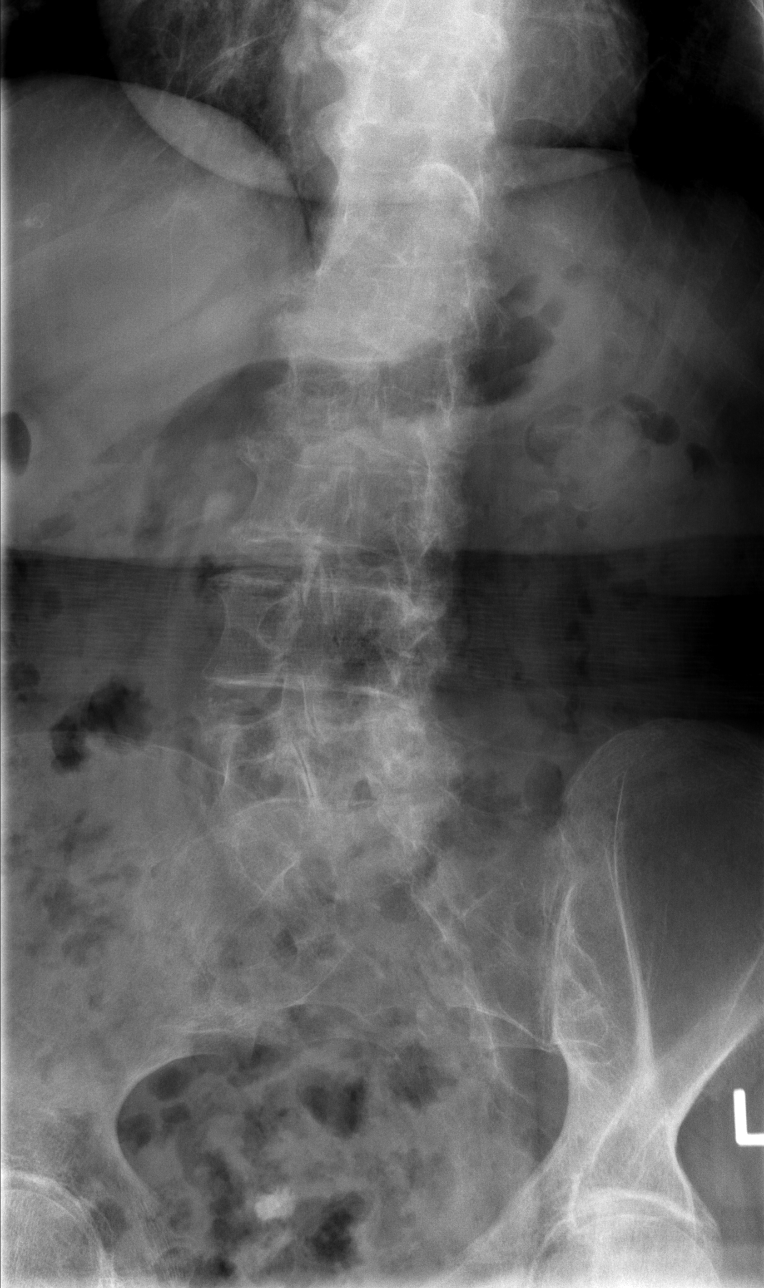

[t l-spine oblique exposure (2 of 2)]
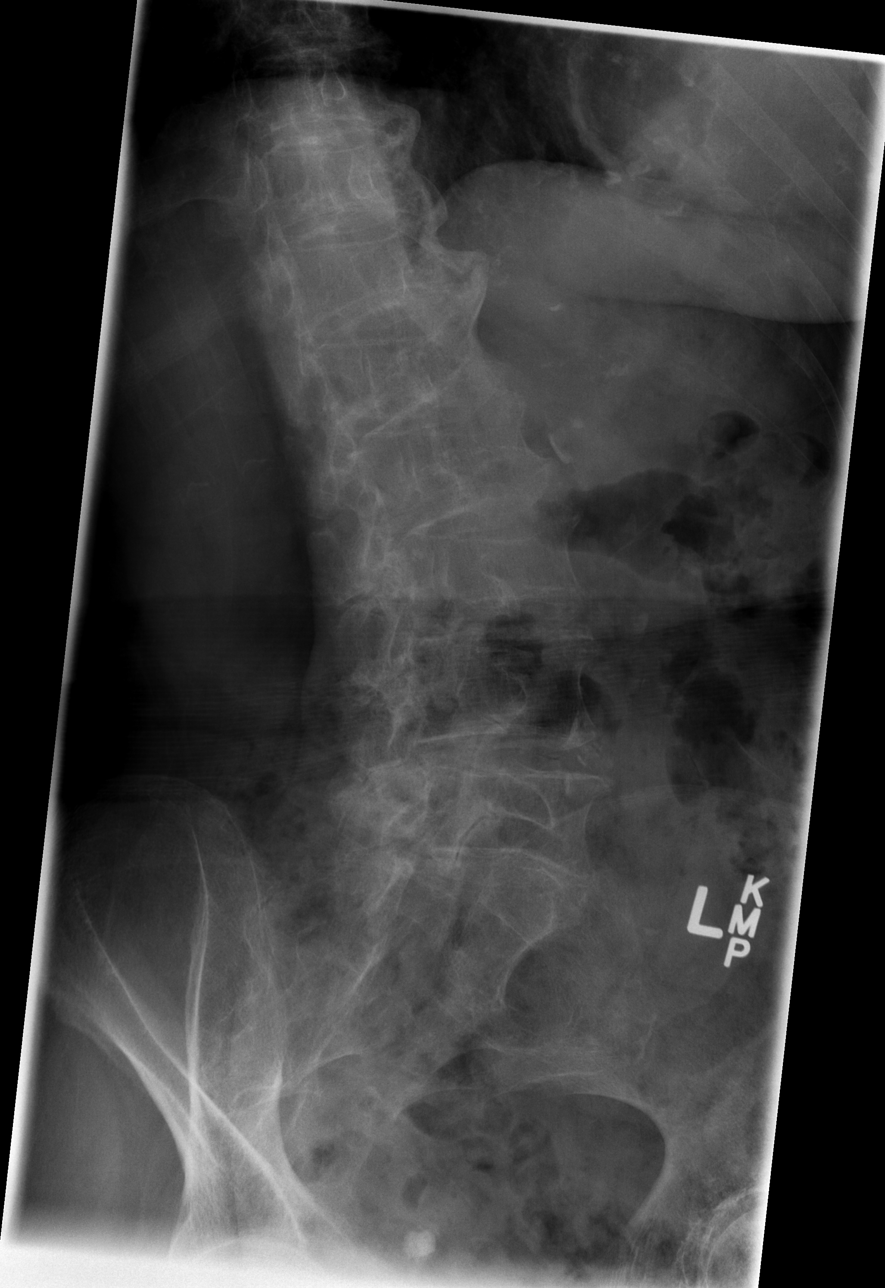

[t l-spine lat]
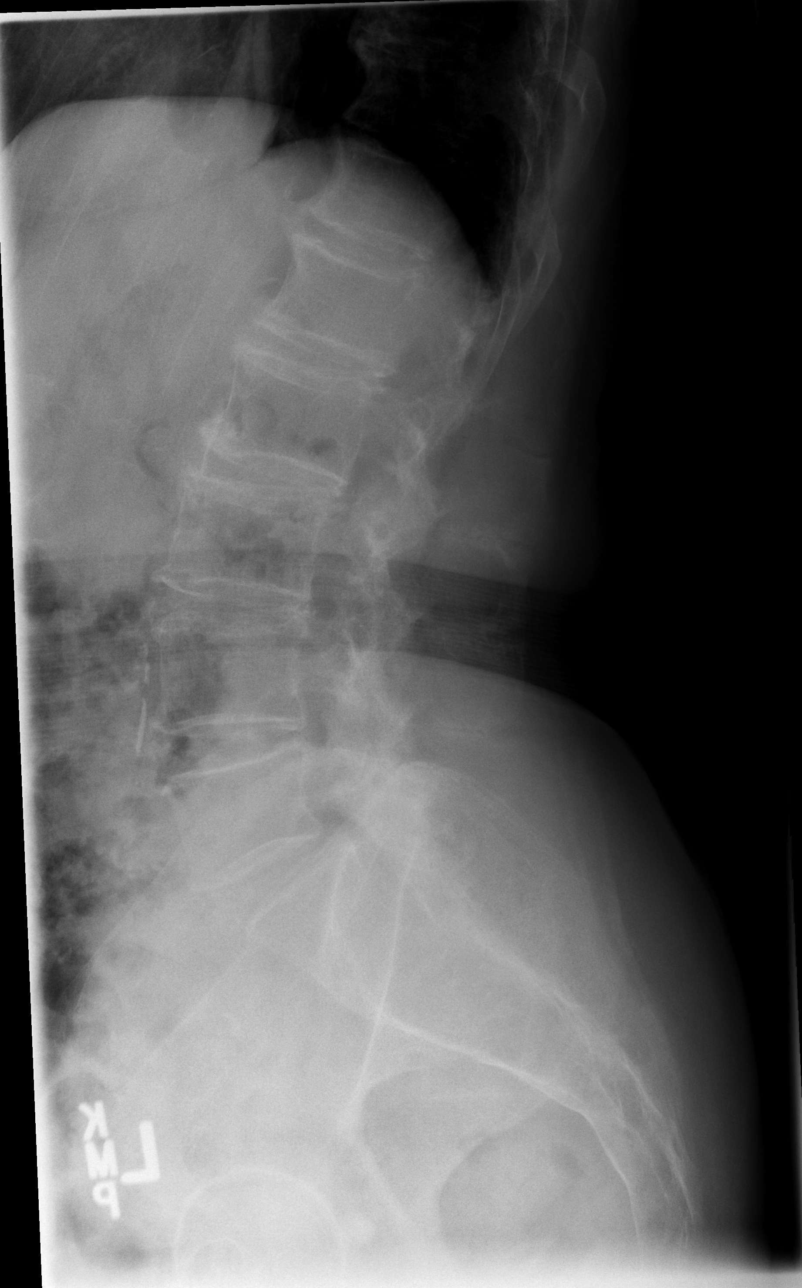

[t l-spine l5-s1 spot]
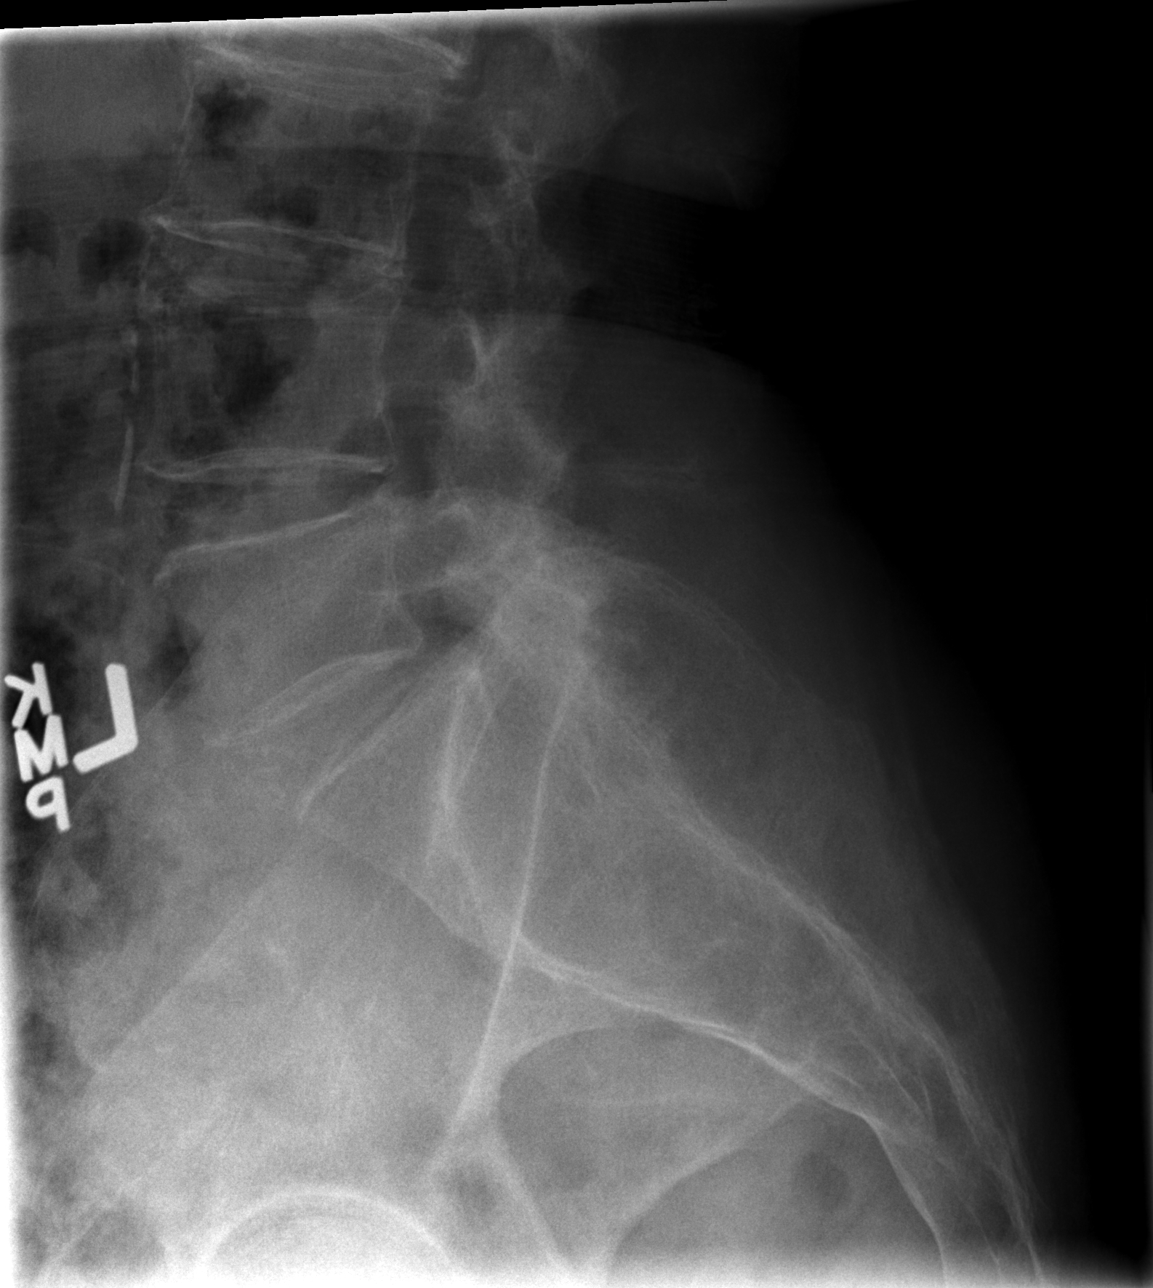

[5 of 5 positions shown; findings below may reference images not displayed]

FINDINGS: Lumbar spine scoliosis concave right 8 degrees. Diffuse degenerative
change. No acute bony abnormality identified. Aortoiliac
atherosclerotic vascular calcification. Pelvic calcification,
possibly a fibroid. 1 cm left upper quadrant calcification possibly
a left renal stone .
IMPRESSION: Lumbar spine scoliosis concave right with diffuse degenerative
change. No acute bony abnormality.

2. Aortoiliac atherosclerotic vascular disease.

3. Pelvic calcification, possibly a fibroid. 1 cm left upper
quadrant calcification, possibly left renal stone.

## 2019-05-17 DEATH — deceased
